# Patient Record
Sex: Female | Born: 1995 | Race: Black or African American | Hispanic: No | Marital: Single | Smoking: Never smoker
Health system: Southern US, Community
[De-identification: ages and names within clinical notes are randomized; demographics above are authoritative.]

## PROBLEM LIST (undated history)

## (undated) ENCOUNTER — Emergency Department (HOSPITAL_COMMUNITY): Admission: EM | Payer: No Typology Code available for payment source | Source: Home / Self Care

## (undated) DIAGNOSIS — Z789 Other specified health status: Secondary | ICD-10-CM

## (undated) HISTORY — PX: WISDOM TOOTH EXTRACTION: SHX21

---
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
## 2015-03-24 ENCOUNTER — Encounter (HOSPITAL_COMMUNITY): Payer: Self-pay | Admitting: Emergency Medicine

## 2015-03-24 ENCOUNTER — Emergency Department (HOSPITAL_COMMUNITY)
Admission: EM | Admit: 2015-03-24 | Discharge: 2015-03-24 | Disposition: A | Discharge status: Home / Self Care | Payer: Medicaid Other | Attending: Emergency Medicine | Admitting: Emergency Medicine

## 2015-03-24 DIAGNOSIS — N898 Other specified noninflammatory disorders of vagina: Secondary | ICD-10-CM | POA: Insufficient documentation

## 2015-03-24 DIAGNOSIS — R1032 Left lower quadrant pain: Secondary | ICD-10-CM | POA: Insufficient documentation

## 2015-03-24 DIAGNOSIS — Z3202 Encounter for pregnancy test, result negative: Secondary | ICD-10-CM | POA: Insufficient documentation

## 2015-03-24 LAB — URINALYSIS, ROUTINE W REFLEX MICROSCOPIC
Bilirubin Urine: NEGATIVE
Glucose, UA: NEGATIVE mg/dL
Hgb urine dipstick: NEGATIVE
Ketones, ur: NEGATIVE mg/dL
LEUKOCYTES UA: NEGATIVE
Nitrite: NEGATIVE
PROTEIN: NEGATIVE mg/dL
Specific Gravity, Urine: 1.007 (ref 1.005–1.030)
UROBILINOGEN UA: 0.2 mg/dL (ref 0.0–1.0)
pH: 7 (ref 5.0–8.0)

## 2015-03-24 LAB — CBC WITH DIFFERENTIAL/PLATELET
BASOS PCT: 0 % (ref 0–1)
Basophils Absolute: 0 10*3/uL (ref 0.0–0.1)
EOS ABS: 0.2 10*3/uL (ref 0.0–0.7)
EOS PCT: 2 % (ref 0–5)
HCT: 43.8 % (ref 36.0–46.0)
Hemoglobin: 14.6 g/dL (ref 12.0–15.0)
LYMPHS ABS: 2.5 10*3/uL (ref 0.7–4.0)
Lymphocytes Relative: 33 % (ref 12–46)
MCH: 30.2 pg (ref 26.0–34.0)
MCHC: 33.3 g/dL (ref 30.0–36.0)
MCV: 90.7 fL (ref 78.0–100.0)
MONOS PCT: 8 % (ref 3–12)
Monocytes Absolute: 0.6 10*3/uL (ref 0.1–1.0)
Neutro Abs: 4.4 10*3/uL (ref 1.7–7.7)
Neutrophils Relative %: 57 % (ref 43–77)
Platelets: 241 10*3/uL (ref 150–400)
RBC: 4.83 MIL/uL (ref 3.87–5.11)
RDW: 13.6 % (ref 11.5–15.5)
WBC: 7.7 10*3/uL (ref 4.0–10.5)

## 2015-03-24 LAB — COMPREHENSIVE METABOLIC PANEL
ALBUMIN: 4 g/dL (ref 3.5–5.0)
ALT: 12 U/L — ABNORMAL LOW (ref 14–54)
AST: 14 U/L — AB (ref 15–41)
Alkaline Phosphatase: 82 U/L (ref 38–126)
Anion gap: 6 (ref 5–15)
BILIRUBIN TOTAL: 0.1 mg/dL — AB (ref 0.3–1.2)
BUN: 9 mg/dL (ref 6–20)
CO2: 27 mmol/L (ref 22–32)
Calcium: 9.5 mg/dL (ref 8.9–10.3)
Chloride: 105 mmol/L (ref 101–111)
Creatinine, Ser: 0.92 mg/dL (ref 0.44–1.00)
GFR calc Af Amer: >60 mL/min (ref >60)
GFR calc non Af Amer: >60 mL/min (ref >60)
Glucose, Bld: 80 mg/dL (ref 65–99)
Potassium: 3.6 mmol/L (ref 3.5–5.1)
Sodium: 138 mmol/L (ref 135–145)
TOTAL PROTEIN: 7.4 g/dL (ref 6.5–8.1)

## 2015-03-24 LAB — WET PREP, GENITAL
Trich, Wet Prep: NONE SEEN
YEAST WET PREP: NONE SEEN

## 2015-03-24 LAB — LIPASE, BLOOD: Lipase: 20 U/L — ABNORMAL LOW (ref 22–51)

## 2015-03-24 LAB — POC URINE PREG, ED: Preg Test, Ur: NEGATIVE

## 2015-03-24 MED ORDER — ONDANSETRON 4 MG PO TBDP
ORAL_TABLET | ORAL | Status: AC
  Filled 2015-03-24: qty 1

## 2015-03-24 MED ORDER — LIDOCAINE HCL (PF) 1 % IJ SOLN
1.0000 mL | Freq: Once | INTRAMUSCULAR | Status: AC
  Administered 2015-03-24: 1 mL
  Filled 2015-03-24: qty 5

## 2015-03-24 MED ORDER — ONDANSETRON 4 MG PO TBDP
4.0000 mg | ORAL_TABLET | Freq: Once | ORAL | Status: AC
  Administered 2015-03-24: 4 mg via ORAL
  Filled 2015-03-24: qty 1

## 2015-03-24 MED ORDER — CEFTRIAXONE SODIUM 250 MG IJ SOLR
250.0000 mg | INTRAMUSCULAR | Status: DC
  Administered 2015-03-24: 250 mg via INTRAMUSCULAR
  Filled 2015-03-24: qty 250

## 2015-03-24 MED ORDER — METRONIDAZOLE 500 MG PO TABS
500.0000 mg | ORAL_TABLET | Freq: Once | ORAL | Status: AC
  Administered 2015-03-24: 500 mg via ORAL
  Filled 2015-03-24: qty 1

## 2015-03-24 MED ORDER — AZITHROMYCIN 250 MG PO TABS
1000.0000 mg | ORAL_TABLET | Freq: Once | ORAL | Status: AC
  Administered 2015-03-24: 1000 mg via ORAL
  Filled 2015-03-24: qty 4

## 2015-03-24 MED ORDER — IBUPROFEN 400 MG PO TABS
600.0000 mg | ORAL_TABLET | Freq: Once | ORAL | Status: AC
  Administered 2015-03-24: 600 mg via ORAL
  Filled 2015-03-24 (×2): qty 1

## 2015-03-24 NOTE — ED Notes (Signed)
Pt left with all belongings and refused wheelchair. 

## 2015-03-24 NOTE — ED Provider Notes (Signed)
CSN: [id number]     Arrival date & time 03/24/15  2047 History   First MD Initiated Contact with Patient 03/24/15 2136     Chief Complaint  Patient presents with  . Abdominal Pain     (Consider location/radiation/quality/duration/timing/severity/associated sxs/prior Treatment) Patient is a 19 y.o. female presenting with vaginal discharge. The history is provided by the patient.  Vaginal Discharge Quality:  White and malodorous Severity:  Mild Onset quality:  Gradual Timing:  Intermittent Progression:  Waxing and waning Chronicity:  New Context: at rest   Relieved by:  None tried Worsened by:  Nothing tried Ineffective treatments:  None tried Associated symptoms: abdominal pain   Associated symptoms: no dyspareunia, no dysuria, no fever, no genital lesions, no nausea, no rash, no urinary frequency, no vaginal itching and no vomiting   Risk factors: unprotected sex   Risk factors: no new sexual partner, no prior miscarriage, no STI and no STI exposure     Past Medical History  Diagnosis Date  . Medical history non-contributory    Past Surgical History  Procedure Laterality Date  . No past surgeries     No family history on file. History  Substance Use Topics  . Smoking status: Never Smoker   . Smokeless tobacco: Not on file  . Alcohol Use: No   OB History    Gravida Para Term Preterm AB TAB SAB Ectopic Multiple Living   0         0     Review of Systems  Constitutional: Negative for fever and fatigue.  Respiratory: Negative for cough, chest tightness and shortness of breath.   Cardiovascular: Negative for chest pain.  Gastrointestinal: Positive for abdominal pain. Negative for nausea, vomiting, diarrhea and constipation.  Genitourinary: Positive for vaginal discharge. Negative for dysuria, flank pain, decreased urine volume, vaginal bleeding, vaginal pain, menstrual problem and dyspareunia.  Neurological: Negative for weakness, light-headedness and headaches.   Psychiatric/Behavioral: Negative for confusion.  All other systems reviewed and are negative.     Allergies  Review of patient's allergies indicates no known allergies.  Home Medications   Prior to Admission medications   Medication Sig Start Date End Date Taking? Authorizing Provider  medroxyPROGESTERone (DEPO-PROVERA) 150 MG/ML injection Inject 150 mg into the muscle every 3 (three) months.   Yes Historical Provider, MD   BP 120/59 mmHg  Pulse 62  Temp(Src) 98.7 F (37.1 C) (Oral)  Resp 16  Ht  (1.549 m)  Wt 138 lb 8 oz (62.823 kg)  BMI 26.18 kg/m2  SpO2 100%  LMP  (LMP Unknown) Physical Exam  Constitutional: She appears well-developed and well-nourished. No distress.  HENT:  Head: Normocephalic and atraumatic.  Nose: Nose normal.  Mouth/Throat: Oropharynx is clear and moist. No oropharyngeal exudate.  Eyes: EOM are normal. Pupils are equal, round, and reactive to light.  Neck: Normal range of motion. Neck supple.  Cardiovascular: Normal rate, regular rhythm, normal heart sounds and intact distal pulses.   No murmur heard. Pulmonary/Chest: Effort normal and breath sounds normal. No respiratory distress. She has no wheezes. She exhibits no tenderness.  Abdominal: Soft. There is no tenderness. There is no rebound and no guarding.  Genitourinary:  Normal external female genitalia.  No rash or external lesions noted.  Moderate amount of thin white discharge noted in the vaginal vault.  Mildly tender to palpation of cervix but no frank chandelier sign.  No adnexal tenderness or fullness  Musculoskeletal: Normal range of motion. She exhibits no tenderness.  Lymphadenopathy:    She has no cervical adenopathy.  Neurological: She is alert. No cranial nerve deficit. Coordination normal.  Skin: Skin is warm and dry. She is not diaphoretic.  Psychiatric: She has a normal mood and affect. Her behavior is normal. Judgment and thought content normal.  Nursing note and vitals  reviewed.   ED Course  Procedures (including critical care time) Labs Review Labs Reviewed  WET PREP, GENITAL - Abnormal; Notable for the following:    Clue Cells Wet Prep HPF POC FEW (*)    WBC, Wet Prep HPF POC FEW (*)    All other components within normal limits  COMPREHENSIVE METABOLIC PANEL - Abnormal; Notable for the following:    AST 14 (*)    ALT 12 (*)    Total Bilirubin 0.1 (*)    All other components within normal limits  LIPASE, BLOOD - Abnormal; Notable for the following:    Lipase 20 (*)    All other components within normal limits  CBC WITH DIFFERENTIAL/PLATELET  URINALYSIS, ROUTINE W REFLEX MICROSCOPIC ([facility])  POC URINE PREG, ED  GC/CHLAMYDIA PROBE AMP ([place]) NOT AT [facility]    Imaging Review No results found.   EKG Interpretation None      MDM   Final diagnoses:  Vaginal discharge  Abdominal cramping in left lower quadrant   Pt is a 19 yo F with no PMH who presents complaining of foul smelling white vaginal discharge and mild left lower abdominal cramping x 1 month.  No n/v/d, no dysuria, afebrile.  No change in urination/defection habits.  Was previously on depo then intentionally stopped 3 months ago.  Denies being sexually active since getting off depo.  Denies wanting any other form of contraception at this time.  No known hx of STDs.  No lesions.  Sexually active with men only, denies any recent instrumentation in her vagina.      Pelvic exam with moderate amount of thin white discharge.  Wet prep and Gc/Cz sent.   UA benign UPT negative  Wet prep with few clue cellls and few WBC.   CBC and CMP wnl.    Elected for empiric treatment of STDs.  Given IM rocephin, azithromycin, and flagyl.  Advised that the labs would return in the next few days and she will receive a call if they come back positive.  Discussed safe sex practices and emphasized condom use now that she has not been on depo for the past few months.  Discharged with  instructions to f/u with PCP in 1 week if sx do not improve.  Will call if swabs return +.    If performed, labs, EKGs, and imaging were reviewed and interpreted by myself and my attending, and incorporated in the medical decision making.  Patient was seen with ED Attending, Dr. Doroteo GlassmanSteinl  Todd Jelinski, MD     Lenell AntuJamie Christan Defranco, MD 03/26/15 1358  Cathren LaineKevin Steinl, MD 03/30/15 0900

## 2015-03-24 NOTE — ED Notes (Signed)
Pt from home for lower abd pain x1 month with vaginal discharge, denies any odor. Pt also denies any dysuria or n/v/d at this time. Pt accompanied by significant other. LMP IN 01/16. NAD NOTED.

## 2015-03-25 LAB — GC/CHLAMYDIA PROBE AMP (~~LOC~~) NOT AT ARMC
CHLAMYDIA, DNA PROBE: NEGATIVE
Neisseria Gonorrhea: NEGATIVE

## 2015-06-07 ENCOUNTER — Encounter (HOSPITAL_COMMUNITY): Payer: Self-pay | Admitting: Emergency Medicine

## 2015-06-07 ENCOUNTER — Emergency Department (HOSPITAL_COMMUNITY)
Admission: EM | Admit: 2015-06-07 | Discharge: 2015-06-08 | Disposition: A | Discharge status: Home / Self Care | Payer: Medicaid Other | Attending: Emergency Medicine | Admitting: Emergency Medicine

## 2015-06-07 DIAGNOSIS — R112 Nausea with vomiting, unspecified: Secondary | ICD-10-CM

## 2015-06-07 DIAGNOSIS — Z91040 Latex allergy status: Secondary | ICD-10-CM | POA: Insufficient documentation

## 2015-06-07 DIAGNOSIS — N898 Other specified noninflammatory disorders of vagina: Secondary | ICD-10-CM | POA: Insufficient documentation

## 2015-06-07 DIAGNOSIS — E860 Dehydration: Secondary | ICD-10-CM | POA: Insufficient documentation

## 2015-06-07 DIAGNOSIS — K5900 Constipation, unspecified: Secondary | ICD-10-CM

## 2015-06-07 DIAGNOSIS — Z3202 Encounter for pregnancy test, result negative: Secondary | ICD-10-CM | POA: Insufficient documentation

## 2015-06-07 LAB — COMPREHENSIVE METABOLIC PANEL
ALBUMIN: 4 g/dL (ref 3.5–5.0)
ALT: 11 U/L — AB (ref 14–54)
ANION GAP: 10 (ref 5–15)
AST: 19 U/L (ref 15–41)
Alkaline Phosphatase: 76 U/L (ref 38–126)
BILIRUBIN TOTAL: 0.3 mg/dL (ref 0.3–1.2)
BUN: 8 mg/dL (ref 6–20)
CALCIUM: 8.9 mg/dL (ref 8.9–10.3)
CO2: 24 mmol/L (ref 22–32)
CREATININE: 1.18 mg/dL — AB (ref 0.44–1.00)
Chloride: 102 mmol/L (ref 101–111)
GFR calc Af Amer: >60 mL/min (ref >60)
GFR calc non Af Amer: >60 mL/min (ref >60)
GLUCOSE: 118 mg/dL — AB (ref 65–99)
Potassium: 3.3 mmol/L — ABNORMAL LOW (ref 3.5–5.1)
SODIUM: 136 mmol/L (ref 135–145)
TOTAL PROTEIN: 7 g/dL (ref 6.5–8.1)

## 2015-06-07 LAB — CBC
HCT: 41.9 % (ref 36.0–46.0)
Hemoglobin: 14.1 g/dL (ref 12.0–15.0)
MCH: 31.2 pg (ref 26.0–34.0)
MCHC: 33.7 g/dL (ref 30.0–36.0)
MCV: 92.7 fL (ref 78.0–100.0)
PLATELETS: 215 10*3/uL (ref 150–400)
RBC: 4.52 MIL/uL (ref 3.87–5.11)
RDW: 12.8 % (ref 11.5–15.5)
WBC: 6.4 10*3/uL (ref 4.0–10.5)

## 2015-06-07 LAB — WET PREP, GENITAL
Clue Cells Wet Prep HPF POC: NONE SEEN
Trich, Wet Prep: NONE SEEN
Yeast Wet Prep HPF POC: NONE SEEN

## 2015-06-07 LAB — LIPASE, BLOOD: Lipase: 24 U/L (ref 22–51)

## 2015-06-07 LAB — I-STAT BETA HCG BLOOD, ED (MC, WL, AP ONLY)

## 2015-06-07 MED ORDER — ONDANSETRON HCL 4 MG/2ML IJ SOLN
4.0000 mg | Freq: Once | INTRAMUSCULAR | Status: AC
  Administered 2015-06-07: 4 mg via INTRAVENOUS
  Filled 2015-06-07: qty 2

## 2015-06-07 MED ORDER — SODIUM CHLORIDE 0.9 % IV BOLUS (SEPSIS)
1000.0000 mL | Freq: Once | INTRAVENOUS | Status: AC
  Administered 2015-06-07: 1000 mL via INTRAVENOUS

## 2015-06-07 NOTE — ED Provider Notes (Signed)
CSN: [id number]     Arrival date & time 06/07/15  2114 History   First MD Initiated Contact with Patient 06/07/15 2216     Chief Complaint  Patient presents with  . Abdominal Pain   Patient is a 19 y.o. female presenting with general illness. The history is provided by the patient. No language interpreter was used.  Illness Location:  Abdomen Quality:  Pain Severity:  Moderate Onset quality:  Gradual Timing:  Constant Progression:  Unchanged Chronicity:  New Context:  Abdominal pain and nausea vomiting. Gradual onset times for 5 days ago with persistent until presentation. Emesis nonbloody nonbilious. Denies fever, chills, diarrhea, hematochezia, melena, hematuria, dysuria, urinary frequency/urgency, vaginal bleeding, or previous history of similar symptoms. Associated symptoms: abdominal pain, nausea and vomiting   Associated symptoms: no chest pain, no diarrhea, no fever, no headaches and no shortness of breath     Past Medical History  Diagnosis Date  . Medical history non-contributory    Past Surgical History  Procedure Laterality Date  . No past surgeries     No family history on file. Social History  Substance Use Topics  . Smoking status: Never Smoker   . Smokeless tobacco: None  . Alcohol Use: No   OB History    Gravida Para Term Preterm AB TAB SAB Ectopic Multiple Living   0         0      Review of Systems  Constitutional: Negative for fever and chills.  Respiratory: Negative for shortness of breath.   Cardiovascular: Negative for chest pain.  Gastrointestinal: Positive for nausea, vomiting, abdominal pain and constipation. Negative for diarrhea, blood in stool, abdominal distention, anal bleeding and rectal pain.  Genitourinary: Positive for vaginal discharge (whitish). Negative for dysuria, urgency, frequency, hematuria and vaginal bleeding.  Neurological: Negative for headaches.  All other systems reviewed and are negative.   Allergies  Latex  Home  Medications   Prior to Admission medications   Medication Sig Start Date End Date Taking? Authorizing Provider  ondansetron (ZOFRAN ODT) 4 MG disintegrating tablet Take 1 tablet (4 mg total) by mouth every 8 (eight) hours as needed for nausea or vomiting. 06/08/15   Angelina Ok, MD   BP 110/50 mmHg  Pulse 60  Temp(Src) 98.6 F (37 C) (Oral)  Resp 16  Ht  (1.549 m)  Wt 132 lb 4.8 oz (60.011 kg)  BMI 25.01 kg/m2  SpO2 100%  LMP 04/18/2015   Physical Exam  Constitutional: She is oriented to person, place, and time. She appears well-developed and well-nourished.  HENT:  Head: Normocephalic and atraumatic.  Eyes: Conjunctivae are normal. Pupils are equal, round, and reactive to light.  Neck: Normal range of motion. Neck supple.  Cardiovascular: Normal rate, regular rhythm and intact distal pulses.   Pulmonary/Chest: Effort normal and breath sounds normal.  Abdominal: Soft. Bowel sounds are normal.  Genitourinary:  Thick whitish discharge, no cervical friability, no CMT, no lesions, no adnexal tenderness  Musculoskeletal: Normal range of motion.  Neurological: She is alert and oriented to person, place, and time.  Skin: Skin is warm.  Nursing note and vitals reviewed.   ED Course  Procedures   Labs Review Labs Reviewed  WET PREP, GENITAL - Abnormal; Notable for the following:    WBC, Wet Prep HPF POC FEW (*)    All other components within normal limits  COMPREHENSIVE METABOLIC PANEL - Abnormal; Notable for the following:    Potassium 3.3 (*)  Glucose, Bld 118 (*)    Creatinine, Ser 1.18 (*)    ALT 11 (*)    All other components within normal limits  URINALYSIS, ROUTINE W REFLEX MICROSCOPIC (NOT AT [facility]) - Abnormal; Notable for the following:    APPearance CLOUDY (*)    Leukocytes, UA SMALL (*)    All other components within normal limits  URINE MICROSCOPIC-ADD ON - Abnormal; Notable for the following:    Squamous Epithelial / LPF FEW (*)    Bacteria, UA MANY  (*)    All other components within normal limits  LIPASE, BLOOD  CBC  I-STAT BETA HCG BLOOD, ED ([facility], [facility], [facility] ONLY)  GC/CHLAMYDIA PROBE AMP ([place]) NOT AT [facility]   Imaging Review No results found. I have personally reviewed and evaluated these images and lab results as part of my medical decision-making.   EKG Interpretation None      MDM  Mrs. Golz is a 19 year female with no past medical history presenting with abdominal pain and nausea vomiting. Gradual onset times for 5 days ago with persistent until presentation. Emesis nonbloody nonbilious. Denies fever, chills, diarrhea, hematochezia, melena, hematuria, dysuria, urinary frequency/urgency, vaginal bleeding, or previous history of similar symptoms. Last BM today.  Exam above notable for young female lying in stretcher distress. Afebrile. Heart rate 6070s. Normotensive. Breathing well on room air maintaining saturations. Abdomen benign. GU exam showing thick whitish discharge, no cervical friability, no CMT, no lesions, no adnexal tenderness.  CMP showing creatinine of 1.18 but otherwise unremarkable. UA showing small leukocytes and many bacteria but negative nitrite and few epithelial cells - appears to be contamination, will not treat empirically, urine culture sent. Urine pregnancy negative. Wet prep negative for clue cells or trichomonas. No need to treat patient empirically for gonorrhea or chlamydia based off GU exam. Bedside ultrasound showing no evidence of cholelithiasis, sludge, or cholecystitis.  Patient symptoms likely related to gastritis. Patient feeling better after IV fluids and antiemetic. Patient discharged home in stable condition with perception for by mouth antibiotic. Strict ED return percussions discussed. Patient stands and agrees with plan has no further questions or concerns this time.  Patient care discussed with and followed by my attending, Dr. Blane Ohara  Final diagnoses:  Nausea and vomiting in  adult  Dehydration  Constipation, unspecified constipation type    Angelina Ok, MD 06/08/15 1610  Blane Ohara, MD 06/11/15 2218

## 2015-06-07 NOTE — ED Notes (Signed)
Pt c/o generalized abdominal pain x 4-5 days with nv and constipation.

## 2015-06-07 NOTE — ED Notes (Signed)
Dr. Alycia Rossetti at the bedside.

## 2015-06-08 LAB — URINALYSIS, ROUTINE W REFLEX MICROSCOPIC
BILIRUBIN URINE: NEGATIVE
Glucose, UA: NEGATIVE mg/dL
HGB URINE DIPSTICK: NEGATIVE
KETONES UR: NEGATIVE mg/dL
NITRITE: NEGATIVE
PH: 6.5 (ref 5.0–8.0)
Protein, ur: NEGATIVE mg/dL
Specific Gravity, Urine: 1.021 (ref 1.005–1.030)
Urobilinogen, UA: 1 mg/dL (ref 0.0–1.0)

## 2015-06-08 LAB — URINE MICROSCOPIC-ADD ON

## 2015-06-08 LAB — GC/CHLAMYDIA PROBE AMP (~~LOC~~) NOT AT ARMC
Chlamydia: NEGATIVE
NEISSERIA GONORRHEA: NEGATIVE

## 2015-06-08 MED ORDER — ONDANSETRON 4 MG PO TBDP: 4.0000 mg | ORAL_TABLET | Freq: Three times a day (TID) | ORAL | Status: DC | PRN

## 2015-06-08 NOTE — ED Notes (Signed)
Dr. Alycia Rossetti in to see the patient.

## 2015-06-08 NOTE — ED Notes (Signed)
Pt left with all belongings and refused wheelchair. Discharge instructions were reviewed and all questions were answered.  

## 2015-07-04 ENCOUNTER — Encounter (HOSPITAL_COMMUNITY): Payer: Self-pay | Admitting: Emergency Medicine

## 2015-07-04 ENCOUNTER — Emergency Department (HOSPITAL_COMMUNITY)
Admission: EM | Admit: 2015-07-04 | Discharge: 2015-07-04 | Discharge status: Against Medical Advice | Payer: Medicaid Other | Attending: Emergency Medicine | Admitting: Emergency Medicine

## 2015-07-04 DIAGNOSIS — N898 Other specified noninflammatory disorders of vagina: Secondary | ICD-10-CM | POA: Insufficient documentation

## 2015-07-04 DIAGNOSIS — N939 Abnormal uterine and vaginal bleeding, unspecified: Secondary | ICD-10-CM

## 2015-07-04 DIAGNOSIS — Z3202 Encounter for pregnancy test, result negative: Secondary | ICD-10-CM | POA: Insufficient documentation

## 2015-07-04 DIAGNOSIS — Z91040 Latex allergy status: Secondary | ICD-10-CM | POA: Insufficient documentation

## 2015-07-04 LAB — COMPREHENSIVE METABOLIC PANEL
ALBUMIN: 4 g/dL (ref 3.5–5.0)
ALT: 10 U/L — ABNORMAL LOW (ref 14–54)
ANION GAP: 10 (ref 5–15)
AST: 17 U/L (ref 15–41)
Alkaline Phosphatase: 73 U/L (ref 38–126)
BUN: 9 mg/dL (ref 6–20)
CO2: 22 mmol/L (ref 22–32)
Calcium: 9.6 mg/dL (ref 8.9–10.3)
Chloride: 107 mmol/L (ref 101–111)
Creatinine, Ser: 0.82 mg/dL (ref 0.44–1.00)
GFR calc non Af Amer: >60 mL/min (ref >60)
GLUCOSE: 89 mg/dL (ref 65–99)
POTASSIUM: 3.7 mmol/L (ref 3.5–5.1)
SODIUM: 139 mmol/L (ref 135–145)
TOTAL PROTEIN: 7 g/dL (ref 6.5–8.1)
Total Bilirubin: 0.4 mg/dL (ref 0.3–1.2)

## 2015-07-04 LAB — CBC
HEMATOCRIT: 42.1 % (ref 36.0–46.0)
HEMOGLOBIN: 14.2 g/dL (ref 12.0–15.0)
MCH: 30.9 pg (ref 26.0–34.0)
MCHC: 33.7 g/dL (ref 30.0–36.0)
MCV: 91.7 fL (ref 78.0–100.0)
PLATELETS: 226 10*3/uL (ref 150–400)
RBC: 4.59 MIL/uL (ref 3.87–5.11)
RDW: 12.9 % (ref 11.5–15.5)
WBC: 7.6 10*3/uL (ref 4.0–10.5)

## 2015-07-04 LAB — URINALYSIS, ROUTINE W REFLEX MICROSCOPIC
Bilirubin Urine: NEGATIVE
GLUCOSE, UA: NEGATIVE mg/dL
KETONES UR: 15 mg/dL — AB
Nitrite: NEGATIVE
PROTEIN: NEGATIVE mg/dL
Specific Gravity, Urine: 1.017 (ref 1.005–1.030)
UROBILINOGEN UA: 0.2 mg/dL (ref 0.0–1.0)
pH: 6.5 (ref 5.0–8.0)

## 2015-07-04 LAB — URINE MICROSCOPIC-ADD ON

## 2015-07-04 LAB — I-STAT BETA HCG BLOOD, ED (MC, WL, AP ONLY)

## 2015-07-04 MED ORDER — KETOROLAC TROMETHAMINE 30 MG/ML IJ SOLN: 30.0000 mg | Freq: Once | INTRAMUSCULAR | Status: DC

## 2015-07-04 NOTE — ED Provider Notes (Signed)
CSN: [id number]     Arrival date & time 07/04/15  2014 History   First MD Initiated Contact with Patient 07/04/15 2102     Chief Complaint  Patient presents with  . Abdominal Pain  . Vaginal Bleeding     (Consider location/radiation/quality/duration/timing/severity/associated sxs/prior Treatment) HPI Comments: This is a 19 year old sexually active female who has not had a menstrual cycle since July.  She was seen on September 19 for diffuse abdominal pain and vaginal discharge.  She was treated at that time for presumed STD.  Now she states that she's had vaginal bleeding for 2 days with clots.  She states prior to the onset of her menses.  She was having vaginal discharge.  Denies any fever, dysuria, nausea, vomiting, constipation.  Has not taken any medication for her discomfort  Patient is a 19 y.o. female presenting with abdominal pain and vaginal bleeding. The history is provided by the patient.  Abdominal Pain Pain location:  Suprapubic Pain quality: cramping   Pain severity:  Moderate Onset quality:  Gradual Duration:  2 days Timing:  Intermittent Progression:  Worsening Chronicity:  New Associated symptoms: vaginal bleeding and vaginal discharge   Associated symptoms: no chills, no dysuria, no fever, no hematuria, no nausea and no vomiting   Vaginal Bleeding Associated symptoms: abdominal pain and vaginal discharge   Associated symptoms: no dysuria, no fever and no nausea     Past Medical History  Diagnosis Date  . Medical history non-contributory    Past Surgical History  Procedure Laterality Date  . No past surgeries     History reviewed. No pertinent family history. Social History  Substance Use Topics  . Smoking status: Never Smoker   . Smokeless tobacco: None  . Alcohol Use: No   OB History    Gravida Para Term Preterm AB TAB SAB Ectopic Multiple Living   0         0     Review of Systems  Constitutional: Negative for fever and chills.   Gastrointestinal: Positive for abdominal pain. Negative for nausea and vomiting.  Genitourinary: Positive for vaginal bleeding, vaginal discharge and menstrual problem. Negative for dysuria, frequency and hematuria.  All other systems reviewed and are negative.     Allergies  Latex  Home Medications   Prior to Admission medications   Medication Sig Start Date End Date Taking? Authorizing Provider  ondansetron (ZOFRAN ODT) 4 MG disintegrating tablet Take 1 tablet (4 mg total) by mouth every 8 (eight) hours as needed for nausea or vomiting. 06/08/15   Angelina Okyan Franasiak, MD   BP 148/90 mmHg  Pulse 62  Temp(Src) 98.9 F (37.2 C) (Oral)  Resp 16  Ht 5\' 1"  (1.549 m)  Wt 131 lb (59.421 kg)  BMI 24.76 kg/m2  SpO2 100%  LMP 04/18/2015 (Exact Date) Physical Exam  Constitutional: She is oriented to person, place, and time. She appears well-developed and well-nourished.  HENT:  Head: Normocephalic.  Eyes: Pupils are equal, round, and reactive to light.  Neck: Normal range of motion.  Cardiovascular: Normal rate and regular rhythm.   Pulmonary/Chest: Effort normal and breath sounds normal.  Abdominal: Soft. She exhibits no distension. There is no tenderness.  Musculoskeletal: Normal range of motion.  Neurological: She is alert and oriented to person, place, and time.  Skin: Skin is warm and dry. No rash noted.  Nursing note and vitals reviewed.   ED Course  Procedures (including critical care time) Labs Review Labs Reviewed  COMPREHENSIVE METABOLIC PANEL -  Abnormal; Notable for the following:    ALT 10 (*)    All other components within normal limits  WET PREP, GENITAL  CBC  URINALYSIS, ROUTINE W REFLEX MICROSCOPIC (NOT AT [facility])  I-STAT BETA HCG BLOOD, ED ([facility], [facility], [facility] ONLY)  GC/CHLAMYDIA PROBE AMP ([place]) NOT AT [facility]    Imaging Review No results found. I have personally reviewed and evaluated these images and lab results as part of my medical decision-making.   EKG  Interpretation None     After my examination prior to the pelvic exam, I was informed by nursing staff that the patient has left the building before she could have treatment or diagnostic examination MDM   Final diagnoses:  Vaginal bleeding         Earley Favor, NP 07/04/15 2135  Gilda Crease, MD 07/04/15 2330

## 2015-07-04 NOTE — ED Notes (Signed)
Went into room to setup for pelvic exam. Patient not in room, hospital gown on chair. Informed Dondra SpryGail NP of same.

## 2015-07-04 NOTE — ED Notes (Signed)
Patient here with complaint of lower abdominal pain coupled with vaginal bleeding. Explains that she went to the bathroom earlier and "big clots fell out". States that clots were coming from vagina. Unsure if pregnant, LMP: July 30th. Denies fever. Endorses nausea.

## 2015-07-04 NOTE — ED Notes (Signed)
Pt left after speaking with PA without telling anyone.

## 2015-08-16 ENCOUNTER — Encounter (HOSPITAL_COMMUNITY): Payer: Self-pay | Admitting: Emergency Medicine

## 2015-08-16 ENCOUNTER — Emergency Department (HOSPITAL_COMMUNITY)
Admission: EM | Admit: 2015-08-16 | Discharge: 2015-08-16 | Disposition: A | Discharge status: Home / Self Care | Payer: Medicaid Other | Attending: Emergency Medicine | Admitting: Emergency Medicine

## 2015-08-16 ENCOUNTER — Emergency Department (HOSPITAL_COMMUNITY): Payer: Medicaid Other

## 2015-08-16 DIAGNOSIS — M545 Low back pain: Secondary | ICD-10-CM | POA: Insufficient documentation

## 2015-08-16 DIAGNOSIS — Z3202 Encounter for pregnancy test, result negative: Secondary | ICD-10-CM | POA: Insufficient documentation

## 2015-08-16 DIAGNOSIS — R112 Nausea with vomiting, unspecified: Secondary | ICD-10-CM | POA: Insufficient documentation

## 2015-08-16 DIAGNOSIS — Z91040 Latex allergy status: Secondary | ICD-10-CM | POA: Insufficient documentation

## 2015-08-16 DIAGNOSIS — R531 Weakness: Secondary | ICD-10-CM | POA: Insufficient documentation

## 2015-08-16 DIAGNOSIS — R51 Headache: Secondary | ICD-10-CM | POA: Insufficient documentation

## 2015-08-16 DIAGNOSIS — R5383 Other fatigue: Secondary | ICD-10-CM | POA: Insufficient documentation

## 2015-08-16 DIAGNOSIS — R509 Fever, unspecified: Secondary | ICD-10-CM | POA: Insufficient documentation

## 2015-08-16 LAB — COMPREHENSIVE METABOLIC PANEL
ALBUMIN: 4 g/dL (ref 3.5–5.0)
ALK PHOS: 63 U/L (ref 38–126)
ALT: 14 U/L (ref 14–54)
ANION GAP: 8 (ref 5–15)
AST: 18 U/L (ref 15–41)
BUN: 8 mg/dL (ref 6–20)
CHLORIDE: 103 mmol/L (ref 101–111)
CO2: 25 mmol/L (ref 22–32)
CREATININE: 1.02 mg/dL — AB (ref 0.44–1.00)
Calcium: 9.3 mg/dL (ref 8.9–10.3)
GFR calc non Af Amer: >60 mL/min (ref >60)
GLUCOSE: 86 mg/dL (ref 65–99)
Potassium: 3.4 mmol/L — ABNORMAL LOW (ref 3.5–5.1)
SODIUM: 136 mmol/L (ref 135–145)
Total Bilirubin: 0.4 mg/dL (ref 0.3–1.2)
Total Protein: 7.1 g/dL (ref 6.5–8.1)

## 2015-08-16 LAB — CBC
HCT: 41.2 % (ref 36.0–46.0)
HEMOGLOBIN: 13.8 g/dL (ref 12.0–15.0)
MCH: 30.9 pg (ref 26.0–34.0)
MCHC: 33.5 g/dL (ref 30.0–36.0)
MCV: 92.2 fL (ref 78.0–100.0)
Platelets: 203 10*3/uL (ref 150–400)
RBC: 4.47 MIL/uL (ref 3.87–5.11)
RDW: 13.3 % (ref 11.5–15.5)
WBC: 6.1 10*3/uL (ref 4.0–10.5)

## 2015-08-16 LAB — LIPASE, BLOOD: LIPASE: 19 U/L (ref 11–51)

## 2015-08-16 LAB — I-STAT BETA HCG BLOOD, ED (MC, WL, AP ONLY): I-stat hCG, quantitative: <5 m[IU]/mL (ref <5)

## 2015-08-16 MED ORDER — IBUPROFEN 200 MG PO TABS
600.0000 mg | ORAL_TABLET | Freq: Once | ORAL | Status: AC
  Administered 2015-08-16: 600 mg via ORAL
  Filled 2015-08-16: qty 1

## 2015-08-16 MED ORDER — SODIUM CHLORIDE 0.9 % IV BOLUS (SEPSIS)
1000.0000 mL | Freq: Once | INTRAVENOUS | Status: AC
  Administered 2015-08-16: 1000 mL via INTRAVENOUS

## 2015-08-16 MED ORDER — ACETAMINOPHEN 500 MG PO TABS
1000.0000 mg | ORAL_TABLET | Freq: Once | ORAL | Status: AC
  Administered 2015-08-16: 1000 mg via ORAL
  Filled 2015-08-16: qty 2

## 2015-08-16 NOTE — ED Notes (Signed)
Pt c/o fever, headache and vomiting onset this morning. Pt took tylenol today at 1200.

## 2015-08-16 NOTE — Discharge Instructions (Signed)
Fever, Adult A fever is an increase in the body's temperature. It is usually defined as a temperature of 100F (38C) or higher. Brief mild or moderate fevers generally have no long-term effects, and they often do not require treatment. Moderate or high fevers may make you feel uncomfortable and can sometimes be a sign of a serious illness or disease. The sweating that may occur with repeated or prolonged fever may also cause dehydration. Fever is confirmed by taking a temperature with a thermometer. A measured temperature can vary with:  Age.  Time of day.  Location of the thermometer:  Mouth (oral).  Rectum (rectal).  Ear (tympanic).  Underarm (axillary).  Forehead (temporal). HOME CARE INSTRUCTIONS Pay attention to any changes in your symptoms. Take these actions to help with your condition:  Take over-the counter and prescription medicines only as told by your health care provider. Follow the dosing instructions carefully.  If you were prescribed an antibiotic medicine, take it as told by your health care provider. Do not stop taking the antibiotic even if you start to feel better.  Rest as needed.  Drink enough fluid to keep your urine clear or pale yellow. This helps to prevent dehydration.  Sponge yourself or bathe with room-temperature water to help reduce your body temperature as needed. Do not use ice water.  Do not overbundle yourself in blankets or heavy clothes. SEEK MEDICAL CARE IF:  You vomit.  You cannot eat or drink without vomiting.  You have diarrhea.  You have pain when you urinate.  Your symptoms do not improve with treatment.  You develop new symptoms.  You develop excessive weakness. SEEK IMMEDIATE MEDICAL CARE IF:  You have shortness of breath or have trouble breathing.  You are dizzy or you faint.  You are disoriented or confused.  You develop signs of dehydration, such as a dry mouth, decreased urination, or paleness.  You develop  severe pain in your abdomen.  You have persistent vomiting or diarrhea.  You develop a skin rash.  Your symptoms suddenly get worse.   This information is not intended to replace advice given to you by your health care provider. Make sure you discuss any questions you have with your health care provider.   Document Released: 03/01/2001 Document Revised: 05/27/2015 Document Reviewed: 10/30/2014 Elsevier Interactive Patient Education 2016 [employer]  

## 2015-08-16 NOTE — ED Provider Notes (Signed)
CSN: [id number]     Arrival date & time 08/16/15  1430 History   First MD Initiated Contact with Patient 08/16/15 1751     Chief Complaint  Patient presents with  . Fever  . Headache  . Emesis     (Consider location/radiation/quality/duration/timing/severity/associated sxs/prior Treatment) HPI  Patient is a 19 year old femalesignificant past medical history who presents to the emergency department with a fever. Reports a one-day history of fever, Tmax 102.7. Reports one-day history of cough with increased sputum production, headache, nausea, vomiting. Also reports lower back pain with generalized fatigue and weakness. Denies neck pain or stiffness, shortness of breath, chest pain, abdominal pain, diarrhea, dysuria, blood in her stool, sore throat. States she received the influenza vaccine couple of months ago.  Past Medical History  Diagnosis Date  . Medical history non-contributory    Past Surgical History  Procedure Laterality Date  . No past surgeries     No family history on file. Social History  Substance Use Topics  . Smoking status: Never Smoker   . Smokeless tobacco: None  . Alcohol Use: No   OB History    Gravida Para Term Preterm AB TAB SAB Ectopic Multiple Living   0         0     Review of Systems  Constitutional: Positive for fever and fatigue. Negative for appetite change.  HENT: Negative for congestion, ear pain, rhinorrhea and trouble swallowing.   Eyes: Negative for visual disturbance.  Respiratory: Positive for cough. Negative for chest tightness, shortness of breath and wheezing.   Cardiovascular: Negative for chest pain.  Gastrointestinal: Positive for nausea and vomiting. Negative for abdominal pain and diarrhea.  Genitourinary: Negative for dysuria, urgency, hematuria, flank pain, vaginal discharge and difficulty urinating.  Musculoskeletal: Positive for myalgias. Negative for back pain, neck pain and neck stiffness.  Skin: Negative for rash.   Neurological: Positive for headaches. Negative for dizziness, seizures, weakness and light-headedness.  Psychiatric/Behavioral: Negative for behavioral problems.      Allergies  Latex  Home Medications   Prior to Admission medications   Medication Sig Start Date End Date Taking? Authorizing Provider  ondansetron (ZOFRAN ODT) 4 MG disintegrating tablet Take 1 tablet (4 mg total) by mouth every 8 (eight) hours as needed for nausea or vomiting. 06/08/15   Angelina Ok, MD   BP 118/72 mmHg  Pulse 98  Temp(Src) 98.1 F (36.7 C) (Oral)  Resp 18  Ht 5' (1.524 m)  Wt 60.328 kg  BMI 25.97 kg/m2  SpO2 100%  LMP 08/11/2015 Physical Exam  Constitutional: She is oriented to person, place, and time. She appears well-developed and well-nourished.  HENT:  Head: Atraumatic.  Mouth/Throat: Oropharynx is clear and moist.  Eyes: Conjunctivae and EOM are normal. Pupils are equal, round, and reactive to light.  Neck: Normal range of motion. Neck supple. No tracheal deviation present. No Brudzinski's sign and no Kernig's sign noted.  Cardiovascular: Normal rate, regular rhythm, normal heart sounds and intact distal pulses.   Pulmonary/Chest: Effort normal and breath sounds normal. No respiratory distress. She has no wheezes. She has no rales.  Abdominal: Soft. Bowel sounds are normal. She exhibits no distension. There is no tenderness. There is no rigidity, no rebound, no guarding, no CVA tenderness, no tenderness at McBurney's point and negative Murphy's sign.  Musculoskeletal: Normal range of motion.  Lymphadenopathy:    She has no cervical adenopathy.  Neurological: She is alert and oriented to person, place, and time.  Skin: Skin  is warm. No pallor.  Psychiatric: She has a normal mood and affect.  Nursing note and vitals reviewed.   ED Course  Procedures (including critical care time) Labs Review Labs Reviewed  COMPREHENSIVE METABOLIC PANEL - Abnormal; Notable for the following:     Potassium 3.4 (*)    Creatinine, Ser 1.02 (*)    All other components within normal limits  LIPASE, BLOOD  CBC  I-STAT BETA HCG BLOOD, ED ([facility], [facility], [facility] ONLY)    Imaging Review Dg Chest 2 View  08/16/2015  CLINICAL DATA:  Cough and fever for 1 day. EXAM: CHEST - 2 VIEW COMPARISON:  None. FINDINGS: The heart size and mediastinal contours are within normal limits. Both lungs are clear. The visualized skeletal structures are unremarkable. IMPRESSION: Negative two view chest x-ray. Electronically Signed   By: Marin Robertshristopher  Mattern M.D.   On: 08/16/2015 20:08   I have personally reviewed and evaluated these images and lab results as part of my medical decision-making.   EKG Interpretation None      MDM   Final diagnoses:  Fever, unspecified fever cause    Patient is a [age] year old female with no significant past medical history who presents to the emergency department with a one-day history of fever associated with headache, cough, emesis, fatigue. On arrival no acute distress, ill appearing. Temp 102.7, HR 101, BP 124/62, SpO2 98% on RA.   WBC 6.1. No significant electrolyte abnormalities. Lipase 19. Urine pregnancy negative. Chest x-ray showed no signs of pneumonia. Patient without symptoms of urinary tract infection, do not feel that a UA is necessary at this time. Patient is not toxic appearing, does not have obvious source of a bacterial infection. Doubt meningitis, no signs of meningismus, neck with full ROM.   Given tylenol and IVF. Tachycardia likely d/t fever.   On reevaluation tachycardia resolved, hemodynamically stable.   Patient most likely with a viral illness. Do not feel that antibiotics are necessary at this time. Discussed symptomatic treatment of her fevers. Discussed following up with PCP in the next week. Discussed strict return precautions for worsening of symptoms. Patient expressed understanding, no questions or concerns at time of discharge.     Corena HerterShannon Faelynn Wynder,  MD 08/16/15 [id number]  Gwyneth SproutWhitney Plunkett, MD 08/18/15 2145

## 2015-12-11 ENCOUNTER — Emergency Department (HOSPITAL_COMMUNITY)
Admission: EM | Admit: 2015-12-11 | Discharge: 2015-12-12 | Disposition: A | Discharge status: Home / Self Care | Payer: No Typology Code available for payment source

## 2015-12-11 NOTE — ED Notes (Signed)
No answer from waiting room.

## 2015-12-13 ENCOUNTER — Inpatient Hospital Stay (HOSPITAL_COMMUNITY)
Admission: AD | Admit: 2015-12-13 | Discharge: 2015-12-13 | Disposition: A | Discharge status: Home / Self Care | Payer: No Typology Code available for payment source | Source: Ambulatory Visit | Attending: Family Medicine | Admitting: Family Medicine

## 2015-12-13 ENCOUNTER — Encounter (HOSPITAL_COMMUNITY): Payer: Self-pay | Admitting: *Deleted

## 2015-12-13 DIAGNOSIS — B3731 Acute candidiasis of vulva and vagina: Secondary | ICD-10-CM

## 2015-12-13 DIAGNOSIS — B373 Candidiasis of vulva and vagina: Secondary | ICD-10-CM

## 2015-12-13 DIAGNOSIS — Z3202 Encounter for pregnancy test, result negative: Secondary | ICD-10-CM | POA: Insufficient documentation

## 2015-12-13 LAB — URINALYSIS, ROUTINE W REFLEX MICROSCOPIC
Bilirubin Urine: NEGATIVE
Glucose, UA: NEGATIVE mg/dL
HGB URINE DIPSTICK: NEGATIVE
Ketones, ur: 15 mg/dL — AB
Leukocytes, UA: NEGATIVE
NITRITE: NEGATIVE
PROTEIN: NEGATIVE mg/dL
Specific Gravity, Urine: 1.01 (ref 1.005–1.030)
pH: 6.5 (ref 5.0–8.0)

## 2015-12-13 LAB — POCT PREGNANCY, URINE: PREG TEST UR: NEGATIVE

## 2015-12-13 LAB — WET PREP, GENITAL
SPERM: NONE SEEN
Trich, Wet Prep: NONE SEEN

## 2015-12-13 MED ORDER — FLUCONAZOLE 150 MG PO TABS: 150.0000 mg | ORAL_TABLET | Freq: Once | ORAL | Status: DC

## 2015-12-13 NOTE — MAU Provider Note (Signed)
History     CSN: [id number]  Arrival date and time: 12/13/15 1945   First Provider Initiated Contact with Patient 12/13/15 2015      Chief Complaint  Patient presents with  . Emesis  . Abdominal Cramping   HPI Comments: Amber Morrow is a 20 y.o. G0P0 who presents today with vomiting, and abdominal cramping. She is concerned about a pregnancy or a STI. She states that she had an "accident" a couple of weeks ago, and either of those things are possible.   Emesis  This is a new problem. The current episode started in the past 7 days. The problem occurs less than 2 times per day. The problem has been unchanged. The emesis has an appearance of bile. There has been no fever. Associated symptoms include abdominal pain. Pertinent negatives include no chills, diarrhea or fever. She has tried nothing for the symptoms.  Abdominal Cramping This is a new problem. The current episode started in the past 7 days. The onset quality is gradual. The problem occurs intermittently. The problem has been unchanged. The pain is located in the suprapubic region. The pain is at a severity of 3/10. The quality of the pain is cramping. The abdominal pain does not radiate. Associated symptoms include nausea and vomiting. Pertinent negatives include no constipation, diarrhea, dysuria, fever or frequency. Nothing aggravates the pain. The pain is relieved by nothing. She has tried nothing for the symptoms.    Past Medical History  Diagnosis Date  . Medical history non-contributory     Past Surgical History  Procedure Laterality Date  . No past surgeries      History reviewed. No pertinent family history.  Social History  Substance Use Topics  . Smoking status: Never Smoker   . Smokeless tobacco: None  . Alcohol Use: No    Allergies:  Allergies  Allergen Reactions  . Latex Hives and Itching    Prescriptions prior to admission  Medication Sig Dispense Refill Last Dose  . ondansetron (ZOFRAN ODT) 4  MG disintegrating tablet Take 1 tablet (4 mg total) by mouth every 8 (eight) hours as needed for nausea or vomiting. 10 tablet 0     Review of Systems  Constitutional: Negative for fever and chills.  Gastrointestinal: Positive for nausea, vomiting and abdominal pain. Negative for diarrhea and constipation.  Genitourinary: Negative for dysuria, urgency and frequency.   Physical Exam   Blood pressure 135/67, pulse 87, temperature 99.3 F (37.4 C), temperature source Oral, resp. rate 16, last menstrual period 11/22/2015.  Physical Exam  Nursing note and vitals reviewed. Constitutional: She is oriented to person, place, and time. She appears well-developed and well-nourished. No distress.  HENT:  Head: Normocephalic.  Cardiovascular: Normal rate.   Respiratory: Effort normal.  GI: Soft. There is no tenderness. There is no rebound.  Genitourinary:   External: no lesion Vagina: small amount of white discharge Cervix: pink, smooth, no CMT Uterus: NSSC Adnexa: NT   Neurological: She is alert and oriented to person, place, and time.  Skin: Skin is warm and dry.  Psychiatric: She has a normal mood and affect.   Results for orders placed or performed during the hospital encounter of 12/13/15 (from the past 24 hour(s))  Urinalysis, Routine w reflex microscopic (not at [facility])     Status: Abnormal   Collection Time: 12/13/15  7:53 PM  Result Value Ref Range   Color, Urine YELLOW YELLOW   APPearance CLEAR CLEAR   Specific Gravity, Urine 1.010 1.005 -  1.030   pH 6.5 5.0 - 8.0   Glucose, UA NEGATIVE NEGATIVE mg/dL   Hgb urine dipstick NEGATIVE NEGATIVE   Bilirubin Urine NEGATIVE NEGATIVE   Ketones, ur 15 (A) NEGATIVE mg/dL   Protein, ur NEGATIVE NEGATIVE mg/dL   Nitrite NEGATIVE NEGATIVE   Leukocytes, UA NEGATIVE NEGATIVE  Pregnancy, urine POC     Status: None   Collection Time: 12/13/15  8:01 PM  Result Value Ref Range   Preg Test, Ur NEGATIVE NEGATIVE   Results for orders placed  or performed during the hospital encounter of 12/13/15 (from the past 24 hour(s))  Urinalysis, Routine w reflex microscopic (not at [facility])     Status: Abnormal   Collection Time: 12/13/15  7:53 PM  Result Value Ref Range   Color, Urine YELLOW YELLOW   APPearance CLEAR CLEAR   Specific Gravity, Urine 1.010 1.005 - 1.030   pH 6.5 5.0 - 8.0   Glucose, UA NEGATIVE NEGATIVE mg/dL   Hgb urine dipstick NEGATIVE NEGATIVE   Bilirubin Urine NEGATIVE NEGATIVE   Ketones, ur 15 (A) NEGATIVE mg/dL   Protein, ur NEGATIVE NEGATIVE mg/dL   Nitrite NEGATIVE NEGATIVE   Leukocytes, UA NEGATIVE NEGATIVE  Pregnancy, urine POC     Status: None   Collection Time: 12/13/15  8:01 PM  Result Value Ref Range   Preg Test, Ur NEGATIVE NEGATIVE  Wet prep, genital     Status: Abnormal   Collection Time: 12/13/15  8:25 PM  Result Value Ref Range   Yeast Wet Prep HPF POC PRESENT (A) NONE SEEN   Trich, Wet Prep NONE SEEN NONE SEEN   Clue Cells Wet Prep HPF POC PRESENT (A) NONE SEEN   WBC, Wet Prep HPF POC FEW (A) NONE SEEN   Sperm NONE SEEN    MAU Course  Procedures  MDM Patient declines HIV/RPR testing   Assessment and Plan   1. Yeast infection involving the vagina and surrounding area    DC home Comfort measures reviewed  RX: diflucan #2 RF  Return to MAU as needed   Follow-up Information    Follow up with [facility].   Why:  If symptoms worsen   Contact information:   [address] [place] [address] [phone]         Tawnya Crook 12/13/2015, 8:18 PM

## 2015-12-13 NOTE — MAU Note (Signed)
Pt states for one week she has been having abdominal pain and vomiting.  Pt states her stool is greenish and liquid.

## 2015-12-13 NOTE — Discharge Instructions (Signed)

## 2015-12-14 LAB — GC/CHLAMYDIA PROBE AMP (~~LOC~~) NOT AT ARMC
Chlamydia: NEGATIVE
Neisseria Gonorrhea: NEGATIVE

## 2016-05-20 IMAGING — CR DG CHEST 2V
2 series · 2 of 2 positions shown · non-contrast
Comparison: None.

CLINICAL DATA: Cough and fever for 1 day.

EXAM:
CHEST - 2 VIEW

[chest pa]
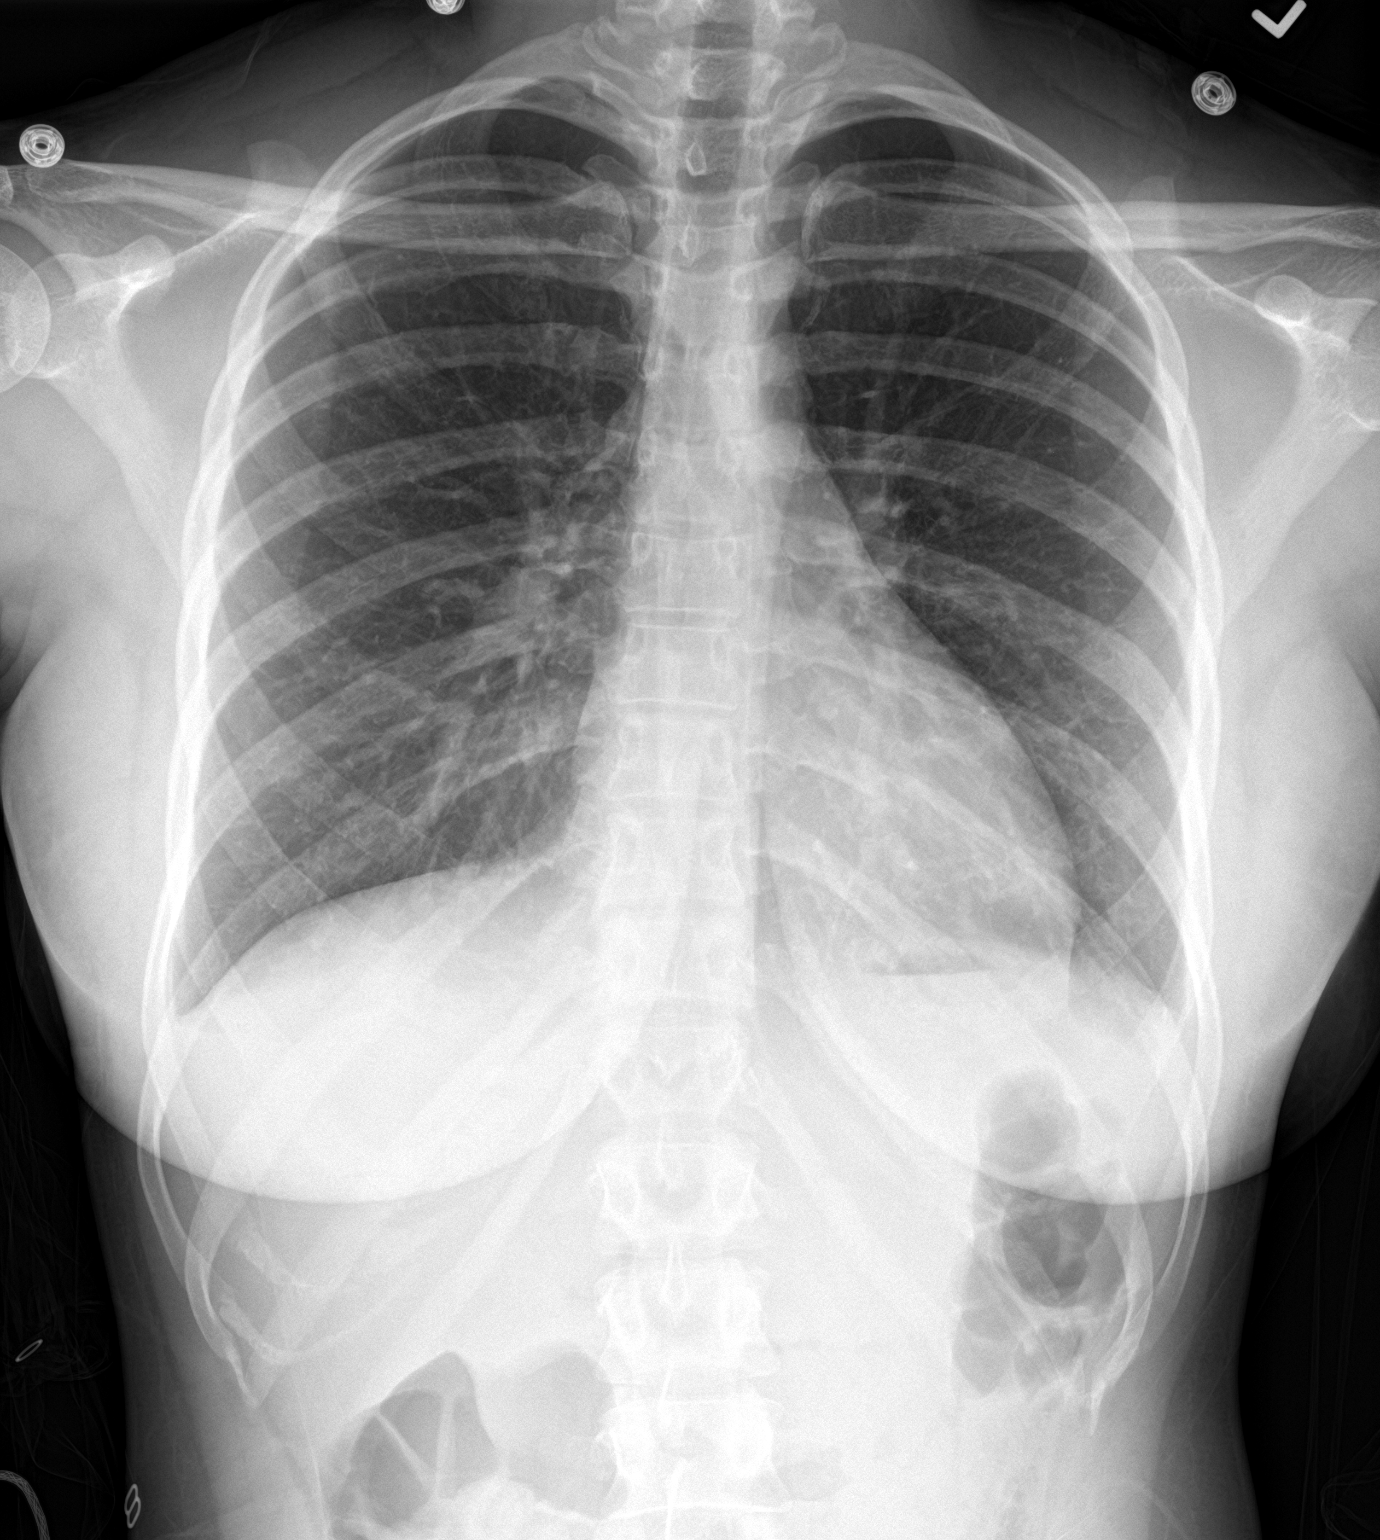

[chest lat]
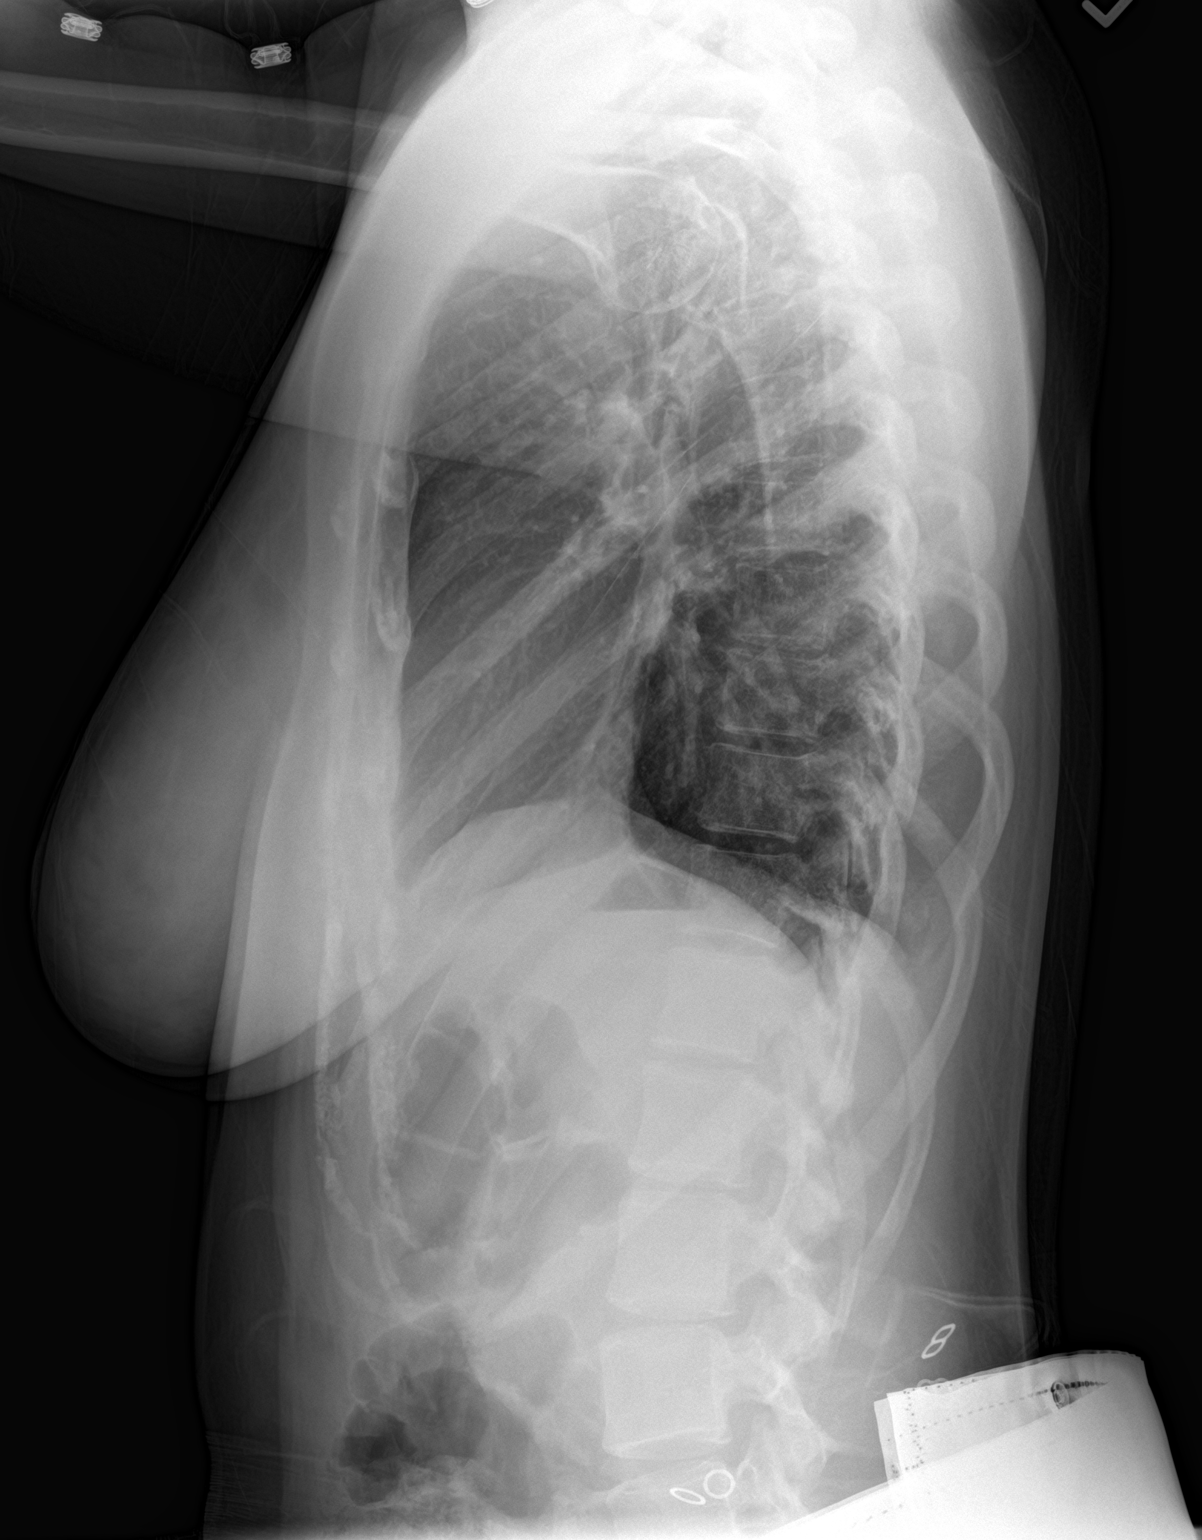

[2 of 2 positions shown; findings below may reference images not displayed]

FINDINGS: The heart size and mediastinal contours are within normal limits.
Both lungs are clear. The visualized skeletal structures are
unremarkable.
IMPRESSION: Negative two view chest x-ray.

## 2017-05-10 ENCOUNTER — Emergency Department (HOSPITAL_COMMUNITY)
Admission: EM | Admit: 2017-05-10 | Discharge: 2017-05-10 | Disposition: A | Discharge status: Home / Self Care | Payer: Self-pay | Attending: Emergency Medicine | Admitting: Emergency Medicine

## 2017-05-10 ENCOUNTER — Encounter (HOSPITAL_COMMUNITY): Payer: Self-pay

## 2017-05-10 DIAGNOSIS — N3001 Acute cystitis with hematuria: Secondary | ICD-10-CM | POA: Insufficient documentation

## 2017-05-10 DIAGNOSIS — Z91040 Latex allergy status: Secondary | ICD-10-CM | POA: Insufficient documentation

## 2017-05-10 LAB — URINALYSIS, ROUTINE W REFLEX MICROSCOPIC
BILIRUBIN URINE: NEGATIVE
GLUCOSE, UA: NEGATIVE mg/dL
Ketones, ur: 5 mg/dL — AB
Nitrite: POSITIVE — AB
PH: 6 (ref 5.0–8.0)
Protein, ur: NEGATIVE mg/dL
SPECIFIC GRAVITY, URINE: 1.019 (ref 1.005–1.030)

## 2017-05-10 LAB — POC URINE PREG, ED: Preg Test, Ur: NEGATIVE

## 2017-05-10 MED ORDER — NITROFURANTOIN MONOHYD MACRO 100 MG PO CAPS: 100.0000 mg | ORAL_CAPSULE | Freq: Two times a day (BID) | ORAL | 0 refills | Status: DC

## 2017-05-10 NOTE — ED Triage Notes (Signed)
Patient c/o dysuria, urinary frequency x 2 days ago. Patient also reports that she has lower abdominal pain 5/6. Patient denies any vaginal discharge

## 2017-05-10 NOTE — Discharge Instructions (Signed)
You're diagnosed with a urinary tract infection today. You are prescribed an antibiotic, Macrobid. Please take all doses at this medication to clear infection. Return for evaluation for any severely worsening abdominal pain, back pain, fever, nausea, vomiting.  Some antibiotics can decrease the effectiveness of birth control pills. If you are taking these or plan to start them, please use a backup method of birth control while taking this medication.  Seek medical care for any symptoms of allergy, like rash; hives; itching; shortness of breath; wheezing; cough; swelling of face, lips, tongue, or throat; or any other signs.  You may take ibuprofen 400 mg every 4 hours as needed for pain. If still requiring this medication around the clock for acute pain after 10 days, please see your primary healthcare provider.  You may combine this medication with Tylenol, 650 mg every 6 hours, so you are receiving something for pain every 3 hours.  Ibuprofen is not a long-term medication unless under the care and direction of your primary provider. Taking this medication long-term and not under the supervision of a healthcare provider could increase the risk of stomach ulcers, kidney problems, and cardiovascular problems such as high blood pressure.     Thank you for allowing [place] to participate in your care today.

## 2017-05-10 NOTE — ED Provider Notes (Signed)
[facility]-EMERGENCY DEPT Provider Note   CSN: [id number] Arrival date & time: 05/10/17  1751     History   Chief Complaint Chief Complaint  Amber Morrow presents with  . Dysuria  . Abdominal Pain    HPI Amber Morrow is a 21 y.o. female.  HPI  Amber Morrow is a 21 year old female with no significant past medical history presenting for 2 days of dysuria and lower abdominal discomfort. Amber Morrow reports that the abdominal pain is intermittent and approximately a 5 out of 10 in intensity. Amber Morrow denies gross hematuria but reports that her urine has been "darker than usual". He reports some nausea and decreased appetite but no vomiting. Amber Morrow denies any dyspareunia, pelvic pain, flank pain, vaginal discharge, vaginal bleeding, diarrhea. Amber Morrow's last menstrual period was August 5 and normal for her. Amber Morrow is sexually active with one partner. Amber Morrow reports a history of gonorrhea that was appropriately treated. He reports she had vaginal yeast earlier this year but symptoms at that time were vaginal itching and discharge. Amber Morrow has not tried any remedies to relieve current complaint.  Past Medical History:  Diagnosis Date  . Medical history non-contributory     There are no active problems to display for this Amber Morrow.   Past Surgical History:  Procedure Laterality Date  . NO PAST SURGERIES      OB History    Gravida Para Term Preterm AB Living   0         0   SAB TAB Ectopic Multiple Live Births                   Home Medications    Prior to Admission medications   Medication Sig Start Date End Date Taking? Authorizing Provider  fluconazole (DIFLUCAN) 150 MG tablet Take 1 tablet (150 mg total) by mouth once. May repeat in 2 days if still having symptoms. 12/13/15   Armando Reichert, CNM    Family History Family History  Problem Relation Age of Onset  . Anxiety disorder Mother     Social History Social History  Substance Use Topics  . Smoking status: Never Smoker  .  Smokeless tobacco: Never Used  . Alcohol use No     Allergies   Latex   Review of Systems Review of Systems  Constitutional: Positive for appetite change. Negative for chills, diaphoresis, fatigue and fever.  HENT: Negative for congestion, rhinorrhea and sore throat.   Eyes: Negative for pain and visual disturbance.  Respiratory: Negative for cough, chest tightness, shortness of breath and wheezing.   Cardiovascular: Negative for chest pain.  Gastrointestinal: Positive for nausea. Negative for abdominal pain, diarrhea and vomiting.  Genitourinary: Positive for dysuria. Negative for difficulty urinating, flank pain and urgency.  Musculoskeletal: Negative for arthralgias, back pain and myalgias.  Skin: Negative for color change and rash.  Neurological: Negative for dizziness, syncope, weakness, light-headedness and headaches.     Physical Exam Updated Vital Signs BP 111/72 (BP Location: Right Arm)   Pulse 98   Temp 98.8 F (37.1 C) (Oral)   Resp 14   Ht 5' 0.5" (1.537 m)   Wt 54.4 kg (120 lb)   LMP 04/23/2017   SpO2 100%   BMI 23.05 kg/m   Physical Exam  Constitutional: She appears well-developed and well-nourished. No distress.  HENT:  Head: Normocephalic and atraumatic.  Mouth/Throat: Oropharynx is clear and moist.  Eyes: Conjunctivae and EOM are normal.  Neck: Normal range of motion. Neck supple.  Cardiovascular: Normal rate, regular rhythm,  S1 normal, S2 normal and normal heart sounds.   No murmur heard. Pulmonary/Chest: Effort normal and breath sounds normal. She has no wheezes. She has no rales.  Abdominal: Soft. She exhibits no distension. There is tenderness. There is no guarding.  Mild discomfort to palpation of left lower quadrant. No guarding or rebound.  Musculoskeletal: Normal range of motion. She exhibits no edema or deformity.  Lymphadenopathy:    She has no cervical adenopathy.  Neurological: She is alert.  Cranial nerves grossly intact. Movement  and coordination normal in all extremities.   Skin: Skin is warm and dry. No rash noted. No erythema.  Psychiatric: She has a normal mood and affect. Her behavior is normal. Judgment and thought content normal.  Nursing note and vitals reviewed.    ED Treatments / Results  Labs (all labs ordered are listed, but only abnormal results are displayed) Labs Reviewed  URINALYSIS, ROUTINE W REFLEX MICROSCOPIC - Abnormal; Notable for the following:       Result Value   Hgb urine dipstick MODERATE (*)    Ketones, ur 5 (*)    Nitrite POSITIVE (*)    Leukocytes, UA MODERATE (*)    Bacteria, UA FEW (*)    Squamous Epithelial / LPF 0-5 (*)    All other components within normal limits  POC URINE PREG, ED    EKG  EKG Interpretation None       Radiology No results found.  Procedures Procedures (including critical care time)  Medications Ordered in ED Medications - No data to display   Initial Impression / Assessment and Plan / ED Course  I have reviewed the triage vital signs and the nursing notes.  Pertinent labs & imaging results that were available during my care of the Amber Morrow were reviewed by me and considered in my medical decision making (see chart for details).  Clinical Course as of May 10 2058  Wed May 10, 2017  2055 Results of pregnancy and UA testing discussed with Amber Morrow. Discussed treatment for antibiotic with MacroBid.   [AM]    Clinical Course User Index [AM] Elisha Ponder, PA-C      Final Clinical Impressions(s) / ED Diagnoses   Final diagnoses:  None   MDM Amber Morrow is a 21 year old female with no significant past medical history presenting for 2 days of dysuria and lower abdominal discomfort. Amber Morrow afebrile and nontoxic appearing. Amber Morrow found to have a urinary tract infection with hematuria and with no evidence of pyelonephritis. Per sexual history of Amber Morrow and lack of symptoms such as vaginal dischage, low suspicion for PID as cause of  Amber Morrow's lower abdominal pain. Amber Morrow prescribed Macrobid and instructed to take ibuprofen for pain. Amber Morrow given strict return precautions.  This is a supervised visit with Dr. Rhunette Croft. Evaluation, management, and discharge planning discussed with Dr. Vivia Ewing.  New Prescriptions New Prescriptions   No medications on file     Delia Chimes 05/11/17 0119    Derwood Kaplan, MD 05/11/17 [phone]

## 2017-05-15 ENCOUNTER — Emergency Department (HOSPITAL_COMMUNITY)
Admission: EM | Admit: 2017-05-15 | Discharge: 2017-05-15 | Disposition: A | Discharge status: Home / Self Care | Payer: Self-pay | Attending: Emergency Medicine | Admitting: Emergency Medicine

## 2017-05-15 ENCOUNTER — Encounter (HOSPITAL_COMMUNITY): Payer: Self-pay

## 2017-05-15 DIAGNOSIS — N9089 Other specified noninflammatory disorders of vulva and perineum: Secondary | ICD-10-CM | POA: Insufficient documentation

## 2017-05-15 DIAGNOSIS — N949 Unspecified condition associated with female genital organs and menstrual cycle: Secondary | ICD-10-CM

## 2017-05-15 DIAGNOSIS — N3091 Cystitis, unspecified with hematuria: Secondary | ICD-10-CM | POA: Insufficient documentation

## 2017-05-15 DIAGNOSIS — N3090 Cystitis, unspecified without hematuria: Secondary | ICD-10-CM

## 2017-05-15 LAB — URINALYSIS, ROUTINE W REFLEX MICROSCOPIC
Glucose, UA: NEGATIVE mg/dL
Ketones, ur: >80 mg/dL — AB
NITRITE: NEGATIVE
PH: 6.5 (ref 5.0–8.0)
Protein, ur: 100 mg/dL — AB

## 2017-05-15 LAB — URINALYSIS, MICROSCOPIC (REFLEX)

## 2017-05-15 LAB — WET PREP, GENITAL
TRICH WET PREP: NONE SEEN
YEAST WET PREP: NONE SEEN

## 2017-05-15 MED ORDER — PHENAZOPYRIDINE HCL 200 MG PO TABS: 200.0000 mg | ORAL_TABLET | Freq: Three times a day (TID) | ORAL | 0 refills | Status: DC

## 2017-05-15 MED ORDER — CEPHALEXIN 500 MG PO CAPS: 500.0000 mg | ORAL_CAPSULE | Freq: Four times a day (QID) | ORAL | 0 refills | Status: DC

## 2017-05-15 NOTE — ED Triage Notes (Addendum)
Patient reports that she was seen 4-5 days ago for a UTI and was given antibiotics. Patient states that she still has 3 more pills to go and is still having hematuria and dysuria. Patient also reports that she has a small wound in the vaginal area that is scabbed over, but is very sore.

## 2017-05-15 NOTE — ED Notes (Signed)
Bed: [facility] Expected date:  Expected time:  Means of arrival:  Comments: 

## 2017-05-15 NOTE — ED Notes (Signed)
Called lab to check on UA.  Results should be crossed over in a few minutes.

## 2017-05-15 NOTE — ED Provider Notes (Signed)
[facility]-EMER[facility]Y DEPT Provider Note   CSN: [id number] Arrival date & time: 05/15/17  1354     History   Chief Complaint Chief Complaint  Patient presents with  . Hematuria  . Dysuria    HPI Amber Morrow is a 21 y.o. female who presents to the ED with dysuria and hematuria. Patient reports that she was here for same 05/10/17 but symptoms have gotten worse despite the Macrobid she has been taking.   The history is provided by the patient. No language interpreter was used.  Hematuria  This is a new problem. The current episode started more than 2 days ago. The problem has been gradually worsening. Associated symptoms include abdominal pain. Pertinent negatives include no headaches. Nothing relieves the symptoms.  Dysuria   Associated symptoms include frequency, hematuria and urgency. Pertinent negatives include no chills, no nausea and no vomiting.    Past Medical History:  Diagnosis Date  . Medical history non-contributory     There are no active problems to display for this patient.   Past Surgical History:  Procedure Laterality Date  . NO PAST SURGERIES      OB History    Gravida Para Term Preterm AB Living   0         0   SAB TAB Ectopic Multiple Live Births                   Home Medications    Prior to Admission medications   Medication Sig Start Date End Date Taking? Authorizing Provider  acetaminophen (TYLENOL) 500 MG tablet Take 500 mg by mouth every 6 (six) hours as needed for moderate pain or fever.   Yes [provider]  cephALEXin (KEFLEX) 500 MG capsule Take 1 capsule (500 mg total) by mouth 4 (four) times daily. 05/15/17   Janne Napoleon, NP  phenazopyridine (PYRIDIUM) 200 MG tablet Take 1 tablet (200 mg total) by mouth 3 (three) times daily. 05/15/17   Janne Napoleon, NP    Family History Family History  Problem Relation Age of Onset  . Anxiety disorder Mother     Social History Social History  Substance Use Topics  . Smoking status:  Never Smoker  . Smokeless tobacco: Never Used  . Alcohol use No     Allergies   Latex   Review of Systems Review of Systems  Constitutional: Positive for fever. Negative for chills.  HENT: Negative.   Eyes: Negative.   Respiratory: Negative.   Gastrointestinal: Positive for abdominal pain. Negative for nausea and vomiting.  Genitourinary: Positive for difficulty urinating (due to pain), dysuria, frequency, genital sores, hematuria and urgency. Negative for dyspareunia, vaginal bleeding and vaginal discharge.  Musculoskeletal: Positive for back pain.  Skin: Negative for rash.  Neurological: Negative for syncope, light-headedness and headaches.  Psychiatric/Behavioral: The patient is not nervous/anxious.      Physical Exam Updated Vital Signs BP 111/72 (BP Location: Right Arm)   Pulse 76   Temp 98.6 F (37 C) (Oral)   Resp 16   Ht 5' 0.5" (1.537 m)   Wt 54.4 kg (120 lb)   LMP 04/23/2017   SpO2 98%   BMI 23.05 kg/m   Physical Exam  Constitutional: She is oriented to person, place, and time. She appears well-developed and well-nourished. No distress.  HENT:  Head: Normocephalic and atraumatic.  Eyes: EOM are normal.  Neck: Normal range of motion. Neck supple.  Cardiovascular: Normal rate and regular rhythm.   Pulmonary/Chest: Effort normal  and breath sounds normal.  Abdominal: Soft. Bowel sounds are normal. There is no tenderness. There is no CVA tenderness.  Genitourinary:  Genitourinary Comments: External genitalia with vesicular lesion near the clitoris , watery d/c vaginal vault. No CMT, no adnexal tenderness or mass palpated, uterus not enlarged.   Musculoskeletal: Normal range of motion.  Neurological: She is alert and oriented to person, place, and time. No cranial nerve deficit.  Skin: Skin is warm and dry.  Psychiatric: She has a normal mood and affect. Her behavior is normal.  Nursing note and vitals reviewed.    ED Treatments / Results  Labs (all  labs ordered are listed, but only abnormal results are displayed) Labs Reviewed  WET PREP, GENITAL - Abnormal; Notable for the following:       Result Value   Clue Cells Wet Prep HPF POC PRESENT (*)    WBC, Wet Prep HPF POC MANY (*)    All other components within normal limits  URINALYSIS, ROUTINE W REFLEX MICROSCOPIC - Abnormal; Notable for the following:    APPearance HAZY (*)    Specific Gravity, Urine >1.030 (*)    Hgb urine dipstick LARGE (*)    Bilirubin Urine SMALL (*)    Ketones, ur >80 (*)    Protein, ur 100 (*)    Leukocytes, UA TRACE (*)    All other components within normal limits  URINALYSIS, MICROSCOPIC (REFLEX) - Abnormal; Notable for the following:    Bacteria, UA RARE (*)    Squamous Epithelial / LPF 0-5 (*)    All other components within normal limits  URINE CULTURE  HSV CULTURE AND TYPING  RPR  HIV ANTIBODY (ROUTINE TESTING)  GC/CHLAMYDIA PROBE AMP ([place]) NOT AT [facility]    Radiology No results found.  Procedures Procedures (including critical care time)  Medications Ordered in ED Medications - No data to display   Initial Impression / Assessment and Plan / ED Course  I have reviewed the triage vital signs and the nursing notes. Pt has been diagnosed with a persistent UTI and vaginal lesion. Pt is afebrile, no CVA tenderness, normotensive, and denies N/V. Pt to be dc home with change in antibiotic and urine culture pending. Instructions to follow up with PCP if symptoms persist. Discussed with the patient that cultures for GC, chlamydia and HSV are pending.   Final Clinical Impressions(s) / ED Diagnoses   Final diagnoses:  Cystitis  Genital lesion, female    New Prescriptions Discharge Medication List as of 05/15/2017  5:53 PM    START taking these medications   Details  cephALEXin (KEFLEX) 500 MG capsule Take 1 capsule (500 mg total) by mouth 4 (four) times daily., Starting Mon 05/15/2017, Print    phenazopyridine (PYRIDIUM) 200 MG tablet  Take 1 tablet (200 mg total) by mouth 3 (three) times daily., Starting Mon 05/15/2017, Print         Remer, Egan, NP 05/15/17 2595    Rolan Bucco, MD 05/15/17 2328

## 2017-05-15 NOTE — ED Notes (Signed)
Pt ambulatory and independent at discharge.  Verbalized understanding of discharge instructions 

## 2017-05-15 NOTE — Discharge Instructions (Signed)
I am giving you instructions about herpes. We have sent the swab for culture but the results may take up to a week so I wanted you to have information in case the results are positive. Have your health care provider repeat your urine test to check for blood. If you continue to have blood in your urine they may want you to see a urologist.

## 2017-05-16 LAB — GC/CHLAMYDIA PROBE AMP (~~LOC~~) NOT AT ARMC
CHLAMYDIA, DNA PROBE: NEGATIVE
Neisseria Gonorrhea: NEGATIVE

## 2017-05-16 LAB — URINE CULTURE

## 2017-05-16 LAB — RPR: RPR: NONREACTIVE

## 2017-05-16 LAB — HIV ANTIBODY (ROUTINE TESTING W REFLEX): HIV SCREEN 4TH GENERATION: NONREACTIVE

## 2017-05-17 ENCOUNTER — Telehealth: Payer: Self-pay | Admitting: Emergency Medicine

## 2017-05-17 LAB — HSV CULTURE AND TYPING

## 2017-05-17 NOTE — Progress Notes (Signed)
ED Antimicrobial Stewardship Positive Culture Follow Up   Amber Morrow is an 21 y.o. female who presented to [facility] on 05/15/2017 with a chief complaint of  Chief Complaint  Patient presents with  . Hematuria  . Dysuria    Recent Results (from the past 720 hour(s))  Urine culture     Status: Abnormal   Collection Time: 05/15/17  4:13 PM  Result Value Ref Range Status   Specimen Description URINE, RANDOM  Final   Special Requests NONE  Final   Culture (A)  Final    50,000 COLONIES/mL LACTOBACILLUS SPECIES Standardized susceptibility testing for this organism is not available. Performed at [facility], [address] [place], [place] [address]    Report Status 05/16/2017 FINAL  Final  Wet prep, genital     Status: Abnormal   Collection Time: 05/15/17  4:19 PM  Result Value Ref Range Status   Yeast Wet Prep HPF POC NONE SEEN NONE SEEN Final   Trich, Wet Prep NONE SEEN NONE SEEN Final   Clue Cells Wet Prep HPF POC PRESENT (A) NONE SEEN Final   WBC, Wet Prep HPF POC MANY (A) NONE SEEN Final   Sperm PRESENT  Final    Plan: Stop cephalexin. Lack of evidence for bacterial infection, suspect HSV (results pending).  ED Provider: Teressa Lower, NP   Roderic Scarce Zigmund Daniel, PharmD PGY1 Pharmacy Resident Pager: [phone]

## 2017-05-17 NOTE — Telephone Encounter (Signed)
Post ED Visit - Positive Culture Follow-up: Successful Patient Follow-Up  Culture assessed and recommendations reviewed by: []  Enzo BiNathan Batchelder, Pharm.D. []  Celedonio MiyamotoJeremy Frens, Pharm.D., BCPS AQ-ID []  Garvin FilaMike Maccia, Pharm.D., BCPS []  Georgina PillionElizabeth Martin, Pharm.D., BCPS []  ClintonMinh Pham, [address]Pharm.D., BCPS, AAHIVP []  Estella HuskMichelle Turner, Pharm.D., BCPS, AAHIVP []  Lysle Pearlachel Rumbarger, PharmD, BCPS []  Casilda Carlsaylor Stone, PharmD, BCPS []  Pollyann SamplesAndy Johnston, PharmD, BCPS Verlan FriendsErin Deja PharmD  Positive urine culture < 50,000 colonies  []  Patient discharged without antimicrobial prescription and treatment is now indicated []  Organism is resistant to prescribed ED discharge antimicrobial []  Patient with positive blood cultures  Changes discussed with ED provider: Teressa LowerVrinda Pickering PA New antibiotic prescription stop cephalexin  Attempting to contact patient   Berle MullMiller, Luciel Brickman 05/17/2017, 12:17 PM

## 2017-08-14 ENCOUNTER — Telehealth: Payer: Self-pay | Admitting: Emergency Medicine

## 2017-08-14 NOTE — Telephone Encounter (Signed)
Lost to followup 

## 2018-01-24 ENCOUNTER — Other Ambulatory Visit: Payer: Self-pay

## 2018-01-24 ENCOUNTER — Inpatient Hospital Stay (HOSPITAL_COMMUNITY)
Admission: AD | Admit: 2018-01-24 | Discharge: 2018-01-24 | Disposition: A | Discharge status: Home / Self Care | Payer: Self-pay | Source: Ambulatory Visit | Attending: Obstetrics & Gynecology | Admitting: Obstetrics & Gynecology

## 2018-01-24 ENCOUNTER — Encounter (HOSPITAL_COMMUNITY): Payer: Self-pay | Admitting: *Deleted

## 2018-01-24 DIAGNOSIS — N940 Mittelschmerz: Secondary | ICD-10-CM

## 2018-01-24 DIAGNOSIS — Z3202 Encounter for pregnancy test, result negative: Secondary | ICD-10-CM | POA: Insufficient documentation

## 2018-01-24 DIAGNOSIS — N946 Dysmenorrhea, unspecified: Secondary | ICD-10-CM | POA: Insufficient documentation

## 2018-01-24 LAB — URINALYSIS, ROUTINE W REFLEX MICROSCOPIC
BILIRUBIN URINE: NEGATIVE
Bacteria, UA: NONE SEEN
Glucose, UA: NEGATIVE mg/dL
Hgb urine dipstick: NEGATIVE
Ketones, ur: NEGATIVE mg/dL
Nitrite: NEGATIVE
PROTEIN: NEGATIVE mg/dL
SPECIFIC GRAVITY, URINE: 1.005 (ref 1.005–1.030)
pH: 7 (ref 5.0–8.0)

## 2018-01-24 LAB — WET PREP, GENITAL
Sperm: NONE SEEN
Trich, Wet Prep: NONE SEEN
Yeast Wet Prep HPF POC: NONE SEEN

## 2018-01-24 LAB — POCT PREGNANCY, URINE: PREG TEST UR: NEGATIVE

## 2018-01-24 MED ORDER — IBUPROFEN 600 MG PO TABS
600.0000 mg | ORAL_TABLET | Freq: Once | ORAL | Status: AC
  Administered 2018-01-24: 600 mg via ORAL
  Filled 2018-01-24: qty 1

## 2018-01-24 NOTE — MAU Provider Note (Signed)
History     CSN: [id number]  Arrival date and time: 01/24/18 4098   First Provider Initiated Contact with Patient 01/24/18 1045      Chief Complaint  Patient presents with  . Pelvic Pain   HPI  Amber Morrow is a 22 y.o. non pregnant female who presents with pelvic pain. This is a recurrent condition. States it occurs almost every month, typically happens 1-2 weeks before her period. LMP was 12/27/17. Current episode started last week. Reports bilateral lower abdominal pain that is cramp like. Rates pain 4/10. Only treats with heating pads occasionally; never takes meds for symptoms. She was previously on depo for contraception that she d/c'd in 2016, no contraception since then. Last relationship lasted 6 months, has not been sexually active in the last 2 months and did not use condoms with that partner.  Denies fever/chills, n/v/d, dysuria, vaginal bleeding, or vaginal discharge.   Pertinent Gynecological History: Menses: regular every 30 days without intermenstrual spotting and usually lasting 5 to 7 days Contraception: none Blood transfusions: none Sexually transmitted diseases: no past history Last pap: never    Past Medical History:  Diagnosis Date  . Medical history non-contributory     Past Surgical History:  Procedure Laterality Date  . WISDOM TOOTH EXTRACTION      Family History  Problem Relation Age of Onset  . Anxiety disorder Mother     Social History   Tobacco Use  . Smoking status: Never Smoker  . Smokeless tobacco: Never Used  Substance Use Topics  . Alcohol use: No  . Drug use: No    Allergies:  Allergies  Allergen Reactions  . Latex Hives and Itching    Medications Prior to Admission  Medication Sig Dispense Refill Last Dose  . Multiple Vitamin (MULTIVITAMIN) tablet Take 1 tablet by mouth daily.   01/23/2018 at Unknown time  . cephALEXin (KEFLEX) 500 MG capsule Take 1 capsule (500 mg total) by mouth 4 (four) times daily. (Patient not taking:  Reported on 01/24/2018) 20 capsule 0 Not Taking at Unknown time  . phenazopyridine (PYRIDIUM) 200 MG tablet Take 1 tablet (200 mg total) by mouth 3 (three) times daily. (Patient not taking: Reported on 01/24/2018) 6 tablet 0 Not Taking at Unknown time    Review of Systems  Constitutional: Negative.   Gastrointestinal: Positive for abdominal pain. Negative for diarrhea, nausea and vomiting.  Genitourinary: Negative.    Physical Exam   Blood pressure 135/85, pulse 60, temperature 99.2 F (37.3 C), temperature source Oral, resp. rate 16, weight 129 lb 12 oz (58.9 kg), last menstrual period 12/27/2017, SpO2 100 %.  Physical Exam  Nursing note and vitals reviewed. Constitutional: She is oriented to person, place, and time. She appears well-developed and well-nourished. No distress.  HENT:  Head: Normocephalic and atraumatic.  Eyes: Conjunctivae are normal. Right eye exhibits no discharge. Left eye exhibits no discharge. No scleral icterus.  Neck: Normal range of motion.  Cardiovascular: Normal rate, regular rhythm and normal heart sounds.  No murmur heard. Respiratory: Effort normal and breath sounds normal. No respiratory distress. She has no wheezes.  GI: Soft. Bowel sounds are normal. She exhibits no distension. There is no tenderness. There is no rebound and no guarding.  Genitourinary: Uterus normal. Cervix exhibits no motion tenderness. Right adnexum displays no mass and no tenderness. Left adnexum displays no mass and no tenderness. No bleeding in the vagina.  Neurological: She is alert and oriented to person, place, and time.  Skin: Skin  is warm and dry. She is not diaphoretic.  Psychiatric: She has a normal mood and affect. Her behavior is normal. Judgment and thought content normal.    MAU Course  Procedures Results for orders placed or performed during the hospital encounter of 01/24/18 (from the past 24 hour(s))  Urinalysis, Routine w reflex microscopic     Status: Abnormal    Collection Time: 01/24/18 10:09 AM  Result Value Ref Range   Color, Urine STRAW (A) YELLOW   APPearance HAZY (A) CLEAR   Specific Gravity, Urine 1.005 1.005 - 1.030   pH 7.0 5.0 - 8.0   Glucose, UA NEGATIVE NEGATIVE mg/dL   Hgb urine dipstick NEGATIVE NEGATIVE   Bilirubin Urine NEGATIVE NEGATIVE   Ketones, ur NEGATIVE NEGATIVE mg/dL   Protein, ur NEGATIVE NEGATIVE mg/dL   Nitrite NEGATIVE NEGATIVE   Leukocytes, UA MODERATE (A) NEGATIVE   RBC / HPF 0-5 0 - 5 RBC/hpf   WBC, UA 21-50 0 - 5 WBC/hpf   Bacteria, UA NONE SEEN NONE SEEN   Squamous Epithelial / LPF 6-10 0 - 5  Pregnancy, urine POC     Status: None   Collection Time: 01/24/18 10:33 AM  Result Value Ref Range   Preg Test, Ur NEGATIVE NEGATIVE  Wet prep, genital     Status: Abnormal   Collection Time: 01/24/18 11:20 AM  Result Value Ref Range   Yeast Wet Prep HPF POC NONE SEEN NONE SEEN   Trich, Wet Prep NONE SEEN NONE SEEN   Clue Cells Wet Prep HPF POC PRESENT (A) NONE SEEN   WBC, Wet Prep HPF POC MODERATE (A) NONE SEEN   Sperm NONE SEEN     MDM UPT negative Ibuprofen 600 mg PO GC/CT & wet prep  Assessment and Plan  A:  1. Dysmenorrhea   2. Pregnancy examination or test, negative result    P: Discharge home Discussed use of NSAIDs when these symptoms occur GC/CT pending Pt to f/u with PCP  Judeth Horn 01/24/2018, 10:45 AM

## 2018-01-24 NOTE — Discharge Instructions (Signed)

## 2018-01-24 NOTE — MAU Note (Signed)
Bad pain in pelvis, started about 3 days ago, comes and goes.  Bad cramping.  Has been seen in the ed before for cramps, she thinks they are worse then they should be, starts a couple wks after cycle-continues until next cycle starts.

## 2018-01-25 LAB — GC/CHLAMYDIA PROBE AMP (~~LOC~~) NOT AT ARMC
CHLAMYDIA, DNA PROBE: NEGATIVE
NEISSERIA GONORRHEA: NEGATIVE
